# Patient Record
Sex: Female | Born: 1955 | Race: White | Hispanic: No | Marital: Married | Smoking: Current every day smoker
Health system: Southern US, Community
[De-identification: ages and names within clinical notes are randomized; demographics above are authoritative.]

## PROBLEM LIST (undated history)

## (undated) DIAGNOSIS — I219 Acute myocardial infarction, unspecified: Secondary | ICD-10-CM

## (undated) DIAGNOSIS — M1990 Unspecified osteoarthritis, unspecified site: Secondary | ICD-10-CM

## (undated) DIAGNOSIS — I2510 Atherosclerotic heart disease of native coronary artery without angina pectoris: Secondary | ICD-10-CM

## (undated) DIAGNOSIS — E785 Hyperlipidemia, unspecified: Secondary | ICD-10-CM

## (undated) HISTORY — DX: Hyperlipidemia, unspecified: E78.5

## (undated) HISTORY — PX: ABDOMINAL HYSTERECTOMY: SHX81

## (undated) HISTORY — DX: Unspecified osteoarthritis, unspecified site: M19.90

## (undated) HISTORY — PX: ABDOMINAL SURGERY: SHX537

## (undated) HISTORY — PX: CHOLECYSTECTOMY: SHX55

## (undated) HISTORY — DX: Atherosclerotic heart disease of native coronary artery without angina pectoris: I25.10

---

## 2005-11-13 ENCOUNTER — Emergency Department (HOSPITAL_COMMUNITY): Admission: EM | Admit: 2005-11-13 | Discharge: 2005-11-14 | Payer: Self-pay | Admitting: Emergency Medicine

## 2005-11-14 ENCOUNTER — Emergency Department (HOSPITAL_COMMUNITY): Admission: EM | Admit: 2005-11-14 | Discharge: 2005-11-14 | Payer: Self-pay | Admitting: Emergency Medicine

## 2006-07-07 ENCOUNTER — Inpatient Hospital Stay (HOSPITAL_COMMUNITY): Admission: EM | Admit: 2006-07-07 | Discharge: 2006-07-09 | Payer: Self-pay | Admitting: Emergency Medicine

## 2006-07-07 ENCOUNTER — Ambulatory Visit: Payer: Self-pay | Admitting: Cardiology

## 2006-07-07 ENCOUNTER — Encounter: Payer: Self-pay | Admitting: Emergency Medicine

## 2006-07-20 ENCOUNTER — Ambulatory Visit: Payer: Self-pay | Admitting: Cardiology

## 2006-12-17 ENCOUNTER — Ambulatory Visit: Payer: Self-pay | Admitting: Cardiology

## 2006-12-17 LAB — CONVERTED CEMR LAB
AST: 19 units/L (ref 0–37)
Albumin: 4 g/dL (ref 3.5–5.2)
Cholesterol: 176 mg/dL (ref 0–200)
HDL: 53.4 mg/dL (ref >39.0)
Total Bilirubin: 0.6 mg/dL (ref 0.3–1.2)
Total CHOL/HDL Ratio: 3.3
Total Protein: 6 g/dL (ref 6.0–8.3)
Triglycerides: 122 mg/dL (ref 0–149)

## 2007-09-23 ENCOUNTER — Ambulatory Visit: Payer: Self-pay | Admitting: Cardiology

## 2007-09-23 LAB — CONVERTED CEMR LAB
ALT: 16 units/L (ref 0–35)
AST: 17 units/L (ref 0–37)
Alkaline Phosphatase: 47 units/L (ref 39–117)
Cholesterol: 188 mg/dL (ref 0–200)
LDL Cholesterol: 110 mg/dL — ABNORMAL HIGH (ref 0–99)
Total Protein: 6.2 g/dL (ref 6.0–8.3)

## 2008-10-06 ENCOUNTER — Ambulatory Visit: Payer: Self-pay | Admitting: Cardiology

## 2009-06-28 ENCOUNTER — Encounter (INDEPENDENT_AMBULATORY_CARE_PROVIDER_SITE_OTHER): Payer: Self-pay | Admitting: *Deleted

## 2010-01-02 ENCOUNTER — Emergency Department (HOSPITAL_COMMUNITY): Admission: EM | Admit: 2010-01-02 | Discharge: 2010-01-02 | Payer: Self-pay | Admitting: Emergency Medicine

## 2010-02-12 ENCOUNTER — Ambulatory Visit (HOSPITAL_COMMUNITY): Admission: RE | Admit: 2010-02-12 | Discharge: 2010-02-12 | Payer: Self-pay | Admitting: Family Medicine

## 2010-02-25 ENCOUNTER — Ambulatory Visit (HOSPITAL_COMMUNITY): Admission: RE | Admit: 2010-02-25 | Discharge: 2010-02-25 | Payer: Self-pay | Admitting: Family Medicine

## 2010-10-28 LAB — URINE MICROSCOPIC-ADD ON

## 2010-10-28 LAB — URINALYSIS, ROUTINE W REFLEX MICROSCOPIC
Bilirubin Urine: NEGATIVE
Nitrite: NEGATIVE
Specific Gravity, Urine: >1.03 — ABNORMAL HIGH (ref 1.005–1.030)
Urobilinogen, UA: 0.2 mg/dL (ref 0.0–1.0)
pH: 6 (ref 5.0–8.0)

## 2010-10-28 LAB — URINE CULTURE: Colony Count: 80000

## 2010-12-24 NOTE — Assessment & Plan Note (Signed)
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  Since I last saw her, there is no dyspnea, chest pain, palpitations, or  syncope.  There is no pedal edema.  She does continue to smoke.   MEDICATIONS:  1. Aspirin 81 mg p.o. daily.  2. Imdur 30 mg p.o. daily.  3. Fish oil.   PHYSICAL EXAMINATION:  VITAL SIGNS:  Today shows a blood pressure of  110/78 and pulse of 72.  She weighs 157 pounds.  HEENT:  Normal.  NECK:  Supple.  CHEST:  Clear.  CARDIOVASCULAR:  Regular rate and rhythm.  ABDOMEN:  No tenderness.  EXTREMITIES:  No edema.   Her electrocardiogram shows sinus rhythm at a rate of 61.  There are no  ST changes noted.   DIAGNOSES:  1. History of small myocardial infarction secondary to vasospasm in      the circumflex - the patient has had no chest pain or shortness of      breath.  We will continue with her aspirin, but I will discontinue      her Imdur.  I have asked her to resume Zocor 80 mg p.o. daily.  2. History of hyperlipidemia - we will check lipids and liver in 6      weeks after reinitiating Zocor and we will adjust as indicated.      Note, she was unable to afford Crestor and Lipitor in the past.  3. Tobacco abuse - we discussed importance of discontinue this will      between 3-10 minutes.  4. History of osteoarthritis.  5. History vaginal bleeding, on Plavix.   We will see her back in 12 months.  I discussed importance of diet and  exercise.     [name] Jens Som, MD, [facility]  Electronically Signed    BSC/MedQ  DD: 10/06/2008  DT: 10/07/2008  Job #: [id number]   cc:   Mila Homer. Sudie Bailey, M.D.

## 2010-12-24 NOTE — Assessment & Plan Note (Signed)
[facility]                            CARDIOLOGY OFFICE NOTE   NAME:Monica Roman, Monica Roman                      MRN:          [id number]  DATE:12/17/2006                            DOB:          September 02, 1955    Monica Roman is a very pleasant 55 year old female who has a history of  small myocardial infarction secondary to vasospasm of the distal  circumflex. Since I last saw her she has done extremely well. There is  no dyspnea on exertion, orthopnea, PND, pedal edema, palpitations, pre  syncope, syncope, or exertional chest pain.   Her medications include:  1. Aspirin 81 mg p.o. daily.  2. Zocor 80 mg p.o. at bedtime.  3. Imdur 30 mg p.o. daily.   Of note she stopped her Norvasc due to low blood pressure.   Her physical exam today shows a blood pressure of 106/69 and her pulse  is 65. She weighs 149 pounds.  NECK: Supple with no bruits.  CHEST: Clear.  CARDIOVASCULAR EXAM: Reveals a regular rate and rhythm.  ABDOMINAL EXAM: Shows no pulsatile masses. No bruits.  EXTREMITIES: Show no edema.   Her electrocardiogram shows a sinus rhythm at a rate of 69. There are no  ST changes noted.   DIAGNOSES:  1. History of myocardial infarction secondary to vasospasm- we will      continue with the patient's aspirin, Imdur, and statin. Note her      Norvasc was discontinued due to low blood pressure.  2. Tobacco abuse- she has decreased her tobacco use from 2 to 3 packs      per day down to 2 to 3 cigarettes per day. We discussed the      importance of completely discontinuing this in the future.  3. History of vaginal bleeding on Plavix- she did not seen Dr.      Sudie Roman for pelvic exam. I have asked her to follow up with him      concerning this issue and she may need a gynecologic evaluation in      the future. Note she has not had a recent pelvic exam.  4. History of osteoarthritis.   We will see her back in approximately 9 months.    Monica Roman  Monica Som, MD, [facility]  Electronically Signed   BSC/MedQ  DD: 12/17/2006  DT: 12/17/2006  Job #: [id number]   cc:   Monica Roman. Monica Roman, M.D.

## 2010-12-24 NOTE — Assessment & Plan Note (Signed)
 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  Since I last saw her she denies any dyspnea on exertion, orthopnea, PND,  pedal edema, palpitations, presyncope, syncope or chest pain.  She is  smoking approximately two cigarettes per day by her report.  She is  trying to exercise, occasionally walking.  She is also tries to follow a  diet.   MEDICATIONS:  1. Aspirin 81 mg p.o. daily.  2. Zocor 80 p.o. mg daily.  3. Imdur 30 mg p.o. daily.   PHYSICAL EXAM TODAY:  Blood pressure of 115/80 and her pulse is 62.  She  weighs 157 pounds.  HEENT:  Normal.  NECK:  Supple with no bruits.  CHEST:  Clear.  CARDIOVASCULAR:  Regular rate.  ABDOMEN:  No tenderness.  EXTREMITIES:  No edema.   Electrocardiogram shows a sinus rhythm at a rate of 62.  The axis is  normal.  There are no ST changes noted.   DIAGNOSES:  1. History of myocardial infarction secondary to vasospasm.  She has      had no symptoms and we will therefore continue her aspirin, Imdur      and statin.  2. Increased cholesterol.  She will need lipids and liver and we will      adjust as indicated.  3. Tobacco abuse.  We discussed the importance of discontinuing this.  4. History of osteoarthritis.  5. History of vaginal bleeding on Plavix.  She continues to not have      gotten a pelvic exam and  I again encouraged this.   We will see her back in 12 months.     [name] Jens Som, MD, [facility]  Electronically Signed   BSC/MedQ  DD: 09/23/2007  DT: 09/25/2007  Job #: [id number]   cc:   Monica Roman. Monica Roman, M.D.

## 2010-12-27 NOTE — H&P (Signed)
Monica Roman, Monica Roman               ACCOUNT NO.:  [id number]   MEDICAL RECORD NO.:  [id number]          PATIENT TYPE:  INP   LOCATION:  2917                         FACILITY:  [facility]   PHYSICIAN:  [name]. Jens Som, MD, FACCDATE OF BIRTH:  1956-05-13   DATE OF ADMISSION:  07/07/2006  DATE OF DISCHARGE:                              HISTORY & PHYSICAL   PATIENT PROFILE:  A 55 year old Caucasian female with a prior history of  tobacco abuse, who presents with acute inferolateral MI.   PROBLEMS:  1. Acute inferolateral MI.  2. Osteoarthritis.  3. Ongoing tobacco abuse, currently a pack a day with a 30-pack year      history.  4. Status post cholecystectomy several years ago.  5. Status post hysterectomy 1984.  6. History of tonsillectomy and appendectomy at age 22.   HISTORY OF PRESENT ILLNESS:  This 55 year old Caucasian female with  history of tobacco abuse.  For the past several years, since her  cholecystectomy, she has had periodic episodes of chest discomfort with  diaphoresis.  She usually takes a Zantac and those symptoms resolve in  about an hour or so.  This morning, she was at work and she took her  break and ate an egg biscuit and had a cigarette, and subsequently  developed 10/10 substernal chest pressure with shortness of breath and  diaphoresis.  She felt the sweating was worse than usual.  She activated  the EMS and upon their arrival, ECG showed inferolateral ST segmental  elevation with anterior T-wave inversion.  She was taken to [facility]  ED and placed on nitroglycerin infusion.  At that point, code STEMI was  activated and she was transferred to [facility] for further evaluation.  She arrived in the [place] ED at 11:40 a.m. and upon our assessment,  we activated the cath lab and she was taking to the cath lab at 11:50  a.m.  She continues to complain of minimal chest soreness, but feels  symptomatically much better.   ALLERGIES:  NO KNOWN DRUG  ALLERGIES.   HOME MEDICATIONS:  1. She uses p.r.n. Zantac and Motrin.  2. In the ED, she received aspirin 81 mg, 4 tablets x1.  3. She also received heparin 5000 units IV x1.   FAMILY HISTORY:  Her father died of coronary disease.   SOCIAL HISTORY:  She lives in [place] with her husband, son, and  granddaughter.  She works in a Engineer, mining.  She has a 30-pack year  history of tobacco abuse, currently smoking 1 pack a day.  She denies  any alcohol or drugs.  She does not routinely exercise.   REVIEW OF SYSTEMS:  Positive for chest pain, shortness of breath,  diaphoresis.  All other systems reviewed are negative.   PHYSICAL EXAM:  VITAL SIGNS:  She is afebrile.  Heart rate 66,  respirations 20, blood pressure 104/68, pulse ox is 100% on 2 liters.  GENERAL:  A pleasant white female in no acute distress.  Awake, alert,  and oriented x3.  NECK:  Normal carotid upstrokes, no bruits or JVD.  LUNGS:  Respirations regular and nonlabored, clear to auscultation.  CARDIAC:  Regular S1, S2, no S3, S4 or murmurs.  ABDOMEN:  Round, soft, nontender, nondistended.  Bowel sounds present  x4.  EXTREMITIES:  Warm, dry, pink.  No clubbing, cyanosis, or edema.  Dorsalis pedes, posterior tibial pulses 2+ and equal bilaterally.  There  is no evidence of femoral bruits.   Chest x-ray is pending.  EKG shows sinus brady with a rate of 56 and  normal axis.  She has ST-segmental elevation in inferolateral leads with  a T-depression and T-wave inversion anteriorly.  Lab work is pending.   ASSESSMENT AND PLAN:  1. Acute inferolateral myocardial infarction.  The patient has been      taken urgently to the cardiac catheterization lab for diagnostic      catheterization.  We will plan to add aspirin, Plavix (depending      upon results of catheterization) statin and beta blocker.  2. Tobacco abuse/tobacco cessation consult.  We have advised her she      needs to quit.  3. Lipid status currently unknown.   We will check lipids and liver      function tests in the a.m.  4. ? Gastroesophageal reflux disease.  She has a history of chest      discomfort symptoms, resolved with Zantac.  It is not clear if this      may have been angina in the past or if that was truly      gastroesophageal reflux disease and we will add Protonix.      Nicolasa Ducking, ANP      [name]. Jens Som, MD, [facility]  Electronically Signed    CB/MEDQ  D:  07/07/2006  T:  07/07/2006  Job:  [id number]

## 2010-12-27 NOTE — Cardiovascular Report (Signed)
NAMEMARCO, [name]               ACCOUNT NO.:  [id number]   MEDICAL RECORD NO.:  [id number]          PATIENT TYPE:  INP   LOCATION:  2807                         FACILITY:  [facility]   PHYSICIAN:  Veverly Fells. Excell Seltzer, MD  DATE OF BIRTH:  [birth year]   DATE OF PROCEDURE:  07/07/2006  DATE OF DISCHARGE:                            CARDIAC CATHETERIZATION   PROCEDURE:  1. Left heart catheterization.  2. Selective coronary angiography.  3,  Left ventricular angiography.  1. Coronary guidewire insertion into the left circumflex.  2. Intracoronary nitroglycerin and adenosine and Angio-Seal of the      right femoral artery.   INDICATIONS:  [name] is a 55 year old woman who presented initially  to [facility] with an evolving acute inferoposterior ST  elevation myocardial infarction.  She was emergently transferred to  [facility], where she was brought to the cath lab for emergent heart  catheterization in the setting of her ongoing injury current and chest  pain.  She reports a 3-year history of intermittent pains that are  similar in nature, but today's event has been her most persistent and  severe pain.   Procedural details, risks, and indications of the procedure were  explained in detail to the patient.  Informed consent was obtained.  The  right groin was prepped, draped, and anesthetized with 1% lidocaine.  Using a front wall arterial puncture, a 6-French sheath was inserted  using the modified Seldinger technique.  Multiple angiographic views of  the left to right coronary arteries were taken.  For the left coronary  artery, a 6 [place] JL-4 catheter was used; for the right coronary  artery, a 6 [place] JR-4 catheter was used.  Following selective coronary  angiography, there appeared to be a high-grade stenosis in the mid left  circumflex that supplies a left PDA.  I therefore proceeded to start  with an intervention on this vessel.  A 6-French XB 3.5 mm guiding  catheter was inserted.  The patient's ACT was 290.  Integrilin was given  for anticoagulation.  A cougar coronary guidewire was inserted.  Interestingly, the cougar guidewire with a loop in the wire passed  easily beyond the area that appeared stenosed into the PDA branch.  Intracoronary nitroglycerin was given, and angiograms demonstrated that  this region of the vessel was now widely patent.  The wire was pulled  back, and intracoronary adenosine was given as well as an additional  dose of nitroglycerin.  Multiple views of this vessel demonstrated that  the vessel was now angiographically normal.  At that point, the  guidewire and guiding catheter were removed, and an angled pigtail  catheter was inserted into the left ventricle where left ventricular  pressures were recorded.  A 30-degree right anterior oblique left  ventriculogram was performed.  Pullback across the aortic valve was  done.  At the conclusion of the case, a 6-French Angio-Seal device was  used to seal the arteriotomy.   FINDINGS:  Aortic pressure 96/57 with a mean of 79.  LV pressure 96/3  with an end-diastolic pressure of 13.   The  left mainstem is angiographically normal.  It bifurcates into the  LAD and left circumflex.  The LAD is large caliber vessel that courses  down left ventricular apex.  It gives off 3 diagonal branches.  The LAD  and diagonals have no significant angiographic disease.   The left circumflex is a large caliber vessel, and it is dominant.  The  proximal vessel has nonobstructive plaque.  It gives off 3 obtuse  marginal branches that are all angiographically normal.  The mid left  circumflex beyond the third obtuse marginal has an area of apparent 99%  stenosis.  The left PDA fills with TIMI 2 flow.   The right coronary artery is angiographically normal.  It is  nondominant.  It is a medium caliber vessel.  There is no significant  angiographic disease throughout the right coronary  artery.   Left ventriculogram performed in a 30-degree right anterior oblique  projection shows normal left ventricular function with a left jugular  ejection fraction estimated at 60%.  There is no mitral regurgitation.   ASSESSMENT:  1. Acute inferoposterior myocardial infarction secondary to vasospasm      of a dominant left circumflex.  2. Normal left ventricular function.   DISCUSSION:  The patient has clearly demonstrated coronary vasospasm.  Following intracoronary nitroglycerin and adenosine, the vessel returned  to normal.  There was no indication to perform balloon angioplasty or  stenting due to the vasospastic nature of the patient's disease.   RECOMMENDATIONS:  Long-acting nitrate therapy as well as a calcium  channel blocker if her blood pressure tolerates.  Smoking cessation will  be critical to her long-term outcome, and this has been clearly  correlated with coronary vasospasm.      Veverly Fells. Excell Seltzer, MD  Electronically Signed     MDC/MEDQ  D:  07/07/2006  T:  07/07/2006  Job:  [id number]   cc:   Mila Homer. Sudie Bailey, M.D.  [facility], [place]

## 2010-12-27 NOTE — Discharge Summary (Signed)
Monica Roman               ACCOUNT NO.:  [id number]   MEDICAL RECORD NO.:  [id number]          PATIENT TYPE:  INP   LOCATION:  2031                         FACILITY:  [facility]   PHYSICIAN:  Monica Frieze. Jens Som, MD, FACCDATE OF BIRTH:  September 07, 1955   DATE OF ADMISSION:  07/07/2006  DATE OF DISCHARGE:  07/09/2006                               DISCHARGE SUMMARY   PRIMARY CARDIOLOGIST:  Dr. Olga Millers.   PRIMARY CARE PHYSICIAN:  Dr. Mila Homer. Knowl[place].   PRINCIPAL DIAGNOSIS:  Acute inferoposterior ST elevation MI.   SECONDARY DIAGNOSES:  1. Significant coronary vasospasm involving the left circumflex.  2. Osteoarthritis.  3. Ongoing tobacco abuse with 30 pack-year history.  4. History of cholecystectomy several years ago.  5. History of hysterectomy in 1984.  6. History of tonsillectomy and appendectomy at age 37.   ALLERGIES:  No known drug allergies.   PROCEDURES:  Left heart cardiac catheterization.   HISTORY OF PRESENT ILLNESS:  A 55 year old white female with prior  history of tobacco abuse who over several years has had intermittent  episodes of bilateral arm and chest discomfort with radiation to the jaw  typically relieved with time and Zantac therapy.  She was in her usual  state of health on the morning of July 07, 2006 when she was at work  and went outside to smoke a cigarette following her morning snack and  developed acute onset of substernal chest pressure associated with  shortness of breath and diaphoresis.  EMS was activated and ECG showed  inferolateral ST-segment elevation with anterior T-wave inversion.  She  was initially taken to [facility] and placed on IV  nitroglycerin.  At that point, a code STEMI was activated and she was  transferred to [facility] for further evaluation.  On arrival at [facility], she still had mild chest soreness with continued ECG changes and  she was taken emergently to the cardiac cath lab for evaluation.   HOSPITAL COURSE:  Cardiac catheterization performed by Dr. Excell Seltzer  revealed a normal left main, a normal LAD, a 99% stenosis in the distal  left circumflex beyond the third obtuse marginal and a normal  nondominant right coronary artery.  After passing a wire into the distal  left circumflex, there was complete resolution of what was felt to be a  lesion with normal flow.  It was felt that this was secondary to  coronary vasospasm and no further intervention was performed.  She was  initiated on calcium channel blocker therapy and has since not had any  recurrent chest discomfort.  She has been maintained on aspirin, Plavix,  statin therapy and also seen by cardiac rehab.  She will be discharged  home today in satisfactory condition.   DISCHARGE LABS:  Hemoglobin 12.1, hematocrit 35.1, WBC 4.7, platelets  107, MCV 91.4.  Sodium 143, potassium 3.9, chloride 111, CO2 27, BUN 6,  creatinine 0.7, glucose 96, total bilirubin 0.7, alkaline phosphatase  44, AST 50, ALT 19, albumin 3.2.  CK 295, MB 60, total cholesterol 144,  triglycerides 78, HDL 53, LDL 75, calcium  9.0.  TSH 0.657.   DISPOSITION:  The patient is being discharged home today in good  condition.   FOLLOW-UP PLANS/APPOINTMENTS:  She is asked to follow up with primary  care physician, Dr. Mila Homer. Knowlton, in 3-4 weeks or as previously  scheduled.  She has follow-up with Dr. Olga Millers on July 20, 2006 at 11:45 a.m. in our [place] office.   DISCHARGE MEDICATIONS:  1. Aspirin 81 mg daily.  2. Plavix 75 mg daily.  3. Simvastatin 40 mg q.h.s.  4. Norvasc 2.5 mg daily.  5. Nitroglycerin 0.4 mg sublingual p.r.n. chest pain.   She has been counseled on the importance of smoking cessation.   OUTSTANDING LAB STUDIES:  None.  Duration of discharge encounter 40  minutes including physician time.      Monica Roman, ANP      Monica Frieze. Jens Som, MD, [facility]  Electronically Signed    CB/MEDQ  D:  07/09/2006   T:  07/09/2006  Job:  [id number]   cc:   Mila Homer. Sudie Bailey, M.D.

## 2010-12-27 NOTE — Assessment & Plan Note (Signed)
[facility]                            CARDIOLOGY OFFICE NOTE   [name], [name]                      MRN:          [id number]  DATE:07/20/2006                            DOB:          06-Sep-1955    Monica Roman returns for followup today.  She was recently admitted to  [facility] with chest pain and was noted to have inferolateral  ST elevation.  She was taken emergently to cardiac catheterization lab  and was found to have a normal left main, normal LAD, 99% stenosis in  the distal left circumflex beyond the 3rd obtuse marginal, and a normal  nondominant right coronary artery.  After passing a wire to the distal  left circumflex, there was complete resolution of the lesion, and it was  felt this was most likely secondary to vasospasm.  She was discharged on  Norvasc.  Since then, she has had 2 episodes of chest pain, both at  rest.  These are not as severe as previous, and resolved with  nitroglycerin.   MEDICATIONS:  1. Plavix 75 mg p.o. daily.  2. Zocor 40 mg p.o. nightly.  3. Norvasc 2.5 mg p.o. daily.  4. Aspirin 81 mg p.o. daily.   PHYSICAL EXAMINATION:  Shows a blood pressure of 128/84.  Pulse is 70.  NECK:  Supple with no bruits.  CHEST:  Clear.  CARDIOVASCULAR:  Exam is irregular.  ABDOMEN:  Exam shows no pulsatile masses and no bruits.  Her right groin shows no hematoma and no bruit.  EXTREMITIES:  Show no edema.  Electrocardiogram shows sinus rhythm at a rate of 73.  There is  inferolateral T-wave inversion.   DIAGNOSES:  1. Recent myocardial infarction secondary to vasospasm.  2. Tobacco abuse.  3. Recent vaginal bleeding.  4. History of osteoarthritis.   PLAN:  Monica Roman is doing reasonably well.  She has had 2 separate  episodes of chest pain.  Her blood pressure appears to be reasonable  today, and I will add Imdur 30 mg p.o. daily for vasospasm.  She is not  taking a beta blocker for that reason as well.   She will return in  approximately 2 weeks, and  we will check lipids and liver and adjust  her Zocor as indicated with a goal LDL of less than 70.  I again,  discussed the importance of discontinuing her tobacco use.  She has  decreased the amount that she is smoking.  We will discontinue her  Plavix due to her vaginal bleeding.  I have asked her to follow up with  [facility] concerning this issue.  She may  need a GYN evaluation to exclude another cause of her bleeding.  We will  see her back in 3 months.     [name] Jens Som, MD, [facility]  Electronically Signed    BSC/MedQ  DD: 07/20/2006  DT: 07/20/2006  Job #: [id number]   cc:   Mila Homer. Sudie Bailey, M.D.

## 2011-03-16 ENCOUNTER — Emergency Department (HOSPITAL_COMMUNITY): Payer: BC Managed Care – PPO

## 2011-03-16 ENCOUNTER — Emergency Department (HOSPITAL_COMMUNITY)
Admission: EM | Admit: 2011-03-16 | Discharge: 2011-03-16 | Disposition: A | Discharge status: Home / Self Care | Payer: BC Managed Care – PPO | Attending: Emergency Medicine | Admitting: Emergency Medicine

## 2011-03-16 ENCOUNTER — Encounter: Payer: Self-pay | Admitting: Emergency Medicine

## 2011-03-16 DIAGNOSIS — Z87891 Personal history of nicotine dependence: Secondary | ICD-10-CM | POA: Insufficient documentation

## 2011-03-16 DIAGNOSIS — R197 Diarrhea, unspecified: Secondary | ICD-10-CM | POA: Insufficient documentation

## 2011-03-16 DIAGNOSIS — I252 Old myocardial infarction: Secondary | ICD-10-CM | POA: Insufficient documentation

## 2011-03-16 DIAGNOSIS — M129 Arthropathy, unspecified: Secondary | ICD-10-CM

## 2011-03-16 HISTORY — DX: Acute myocardial infarction, unspecified: I21.9

## 2011-03-16 MED ORDER — IBUPROFEN 800 MG PO TABS: 800.0000 mg | ORAL_TABLET | Freq: Three times a day (TID) | ORAL | Status: AC

## 2011-03-16 MED ORDER — KETOROLAC TROMETHAMINE 60 MG/2ML IM SOLN
60.0000 mg | Freq: Once | INTRAMUSCULAR | Status: AC
  Administered 2011-03-16: 60 mg via INTRAMUSCULAR
  Filled 2011-03-16: qty 2

## 2011-03-16 NOTE — ED Provider Notes (Signed)
History     CSN: [id number] Arrival date & time: 03/16/2011  4:06 PM  Chief Complaint  Patient presents with  . Leg Swelling  . Shoulder Pain   HPI Comments: Patient presents with left shoulder pain and bilateral ankle pain. The shoulder pain started approximately 2 weeks ago, has been constant, it is moderate, worse with rotation and abduction. There is no associated swelling, redness, fevers. Last night she noticed that her left ankle started to swell and become tender followed by her right ankle. Today she was at work and had an antalgic gait, was referred to the emergency department for further evaluation prior to returning to work. Patient denies fevers, redness, cough, nausea, vaginal discharge, history of sexually transmitted disease.,  Patient is a 55 y.o. female presenting with shoulder pain. The history is provided by the patient and a relative.  Shoulder Pain Pertinent negatives include no chest pain, no abdominal pain, no headaches and no shortness of breath.    Past Medical History  Diagnosis Date  . MI (myocardial infarction)     Past Surgical History  Procedure Date  . Abdominal hysterectomy   . Cholecystectomy   . Abdominal surgery     Family History  Problem Relation Age of Onset  . Diabetes Mother     History  Substance Use Topics  . Smoking status: Former Games developer  . Smokeless tobacco: Not on file  . Alcohol Use: No    OB History    Grav Para Term Preterm Abortions TAB SAB Ect Mult Living   2 2 2       2       Review of Systems  Constitutional: Negative for fever and chills.  HENT: Negative for sore throat and neck pain.   Eyes: Negative for visual disturbance.  Respiratory: Negative for cough and shortness of breath.   Cardiovascular: Negative for chest pain.  Gastrointestinal: Negative for nausea, vomiting, abdominal pain and diarrhea.  Genitourinary: Negative for dysuria and frequency.  Musculoskeletal: Positive for joint swelling and  arthralgias. Negative for myalgias and back pain.  Skin: Negative for rash.  Neurological: Negative for weakness, numbness and headaches.  Hematological: Negative for adenopathy.  Psychiatric/Behavioral: Negative for behavioral problems.    Physical Exam  BP 123/60  Pulse 69  Temp(Src) 97.6 F (36.4 C) (Oral)  Resp 20  Ht 5\' 8"  (1.727 m)  Wt 145 lb (65.772 kg)  BMI 22.05 kg/m2  SpO2 100%  Physical Exam  Constitutional: She appears well-developed and well-nourished. No distress.  HENT:  Head: Normocephalic and atraumatic.  Mouth/Throat: Oropharynx is clear and moist. No oropharyngeal exudate.  Eyes: Conjunctivae and EOM are normal. Pupils are equal, round, and reactive to light. Right eye exhibits no discharge. Left eye exhibits no discharge. No scleral icterus.  Neck: Normal range of motion. Neck supple. No JVD present. No thyromegaly present.  Cardiovascular: Normal rate, regular rhythm, normal heart sounds and intact distal pulses.  Exam reveals no gallop and no friction rub.   No murmur heard. Pulmonary/Chest: Effort normal and breath sounds normal. No respiratory distress. She has no wheezes. She has no rales.  Abdominal: Soft. Bowel sounds are normal. She exhibits no distension and no mass. There is no tenderness.  Musculoskeletal: Normal range of motion. She exhibits tenderness. She exhibits no edema.       Tenderness to palpation of the left shoulder without redness, swelling, crepitance. Tenderness to the bilateral ankles left greater than right without swelling, redness, warmth, injury. There are normal  pulses at the dorsalis pedis bilaterally, normal capillary refill in all 4 extremities. There is no tenderness to the knees, elbows, hips bilaterally. She has normal grips without any arthritis of the hands.  Lymphadenopathy:    She has no cervical adenopathy.  Neurological: She is alert. Coordination normal.  Skin: Skin is warm and dry. No rash noted. She is not  diaphoretic. No erythema.  Psychiatric: She has a normal mood and affect. Her behavior is normal.    ED Course  Procedures  MDM  patient has no history of arthritis though she has noted to have a slight watery diarrhea nightly for the last 2 weeks. She denies history of this prior to 2 weeks ago. There are no signs of septic joints including redness warmth or fevers. 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 With the ongoing left shoulder pain we'll obtain imaging of the left shoulder and anti-inflammatories for the arthritis pain. Reevaluate the  Imaging reveals osteoarthritis of the a.c. joint on the left but no other sources of fracture or effusion. Will send patient home with anti-inflammatories and followup with her primary care provider.  Dg Shoulder Left  03/16/2011  *RADIOLOGY REPORT*  Clinical Data: Pain and swelling along the shoulder.  LEFT SHOULDER - 2+ VIEW  Comparison: None.  Findings: No acute osseous or joint abnormality.  Degenerative changes are seen in the left acromioclavicular joint.  Visualized portion of the left chest is unremarkable.  IMPRESSION: Left acromioclavicular joint osteoarthritis.  Original Report Authenticated By: Reyes Ivan, M.D.        Vida Roller, MD 03/16/11 [id number]

## 2011-03-16 NOTE — ED Notes (Addendum)
Pt c/o l shoulder pain x 2 weeks-worse with movement, and left ankle swellingx 1 day. Denies cp/sob. Pt also states she has swelling/pain on entire left side. nad noted.

## 2011-03-16 NOTE — ED Notes (Signed)
Pt a/ox4. Resp even and unlabored. NAD at this time. D/C instructions reviewed with pt. Pt verbalized understanding. Pt escorted to d/c desk. Pt ambulated with steady gate. 

## 2011-05-16 ENCOUNTER — Telehealth: Payer: Self-pay | Admitting: Cardiovascular Disease

## 2011-05-16 NOTE — Telephone Encounter (Signed)
Faxed the patient's cath report from 2007 to [phone] Attn: Becky.  Becky's call back number is [phone]..05-16-11 djc

## 2011-05-20 ENCOUNTER — Encounter: Payer: Self-pay | Admitting: Cardiology

## 2011-05-20 ENCOUNTER — Encounter: Payer: Self-pay | Admitting: *Deleted

## 2011-05-20 ENCOUNTER — Ambulatory Visit (HOSPITAL_COMMUNITY)
Admission: RE | Admit: 2011-05-20 | Discharge: 2011-05-20 | Disposition: A | Discharge status: Home / Self Care | Payer: BC Managed Care – PPO | Source: Ambulatory Visit | Attending: Family Medicine | Admitting: Family Medicine

## 2011-05-20 DIAGNOSIS — I2510 Atherosclerotic heart disease of native coronary artery without angina pectoris: Secondary | ICD-10-CM | POA: Insufficient documentation

## 2011-05-20 NOTE — Progress Notes (Signed)
*  PRELIMINARY RESULTS* Echocardiogram 2D Echocardiogram has been performed.  Conrad Milo 05/20/2011, 8:27 AM

## 2011-05-21 ENCOUNTER — Ambulatory Visit (INDEPENDENT_AMBULATORY_CARE_PROVIDER_SITE_OTHER): Payer: BC Managed Care – PPO | Admitting: Cardiology

## 2011-05-21 ENCOUNTER — Encounter: Payer: Self-pay | Admitting: Cardiology

## 2011-05-21 DIAGNOSIS — I2510 Atherosclerotic heart disease of native coronary artery without angina pectoris: Secondary | ICD-10-CM

## 2011-05-21 DIAGNOSIS — E785 Hyperlipidemia, unspecified: Secondary | ICD-10-CM | POA: Insufficient documentation

## 2011-05-21 DIAGNOSIS — R072 Precordial pain: Secondary | ICD-10-CM

## 2011-05-21 DIAGNOSIS — Z720 Tobacco use: Secondary | ICD-10-CM

## 2011-05-21 DIAGNOSIS — R079 Chest pain, unspecified: Secondary | ICD-10-CM | POA: Insufficient documentation

## 2011-05-21 DIAGNOSIS — F17200 Nicotine dependence, unspecified, uncomplicated: Secondary | ICD-10-CM

## 2011-05-21 MED ORDER — SIMVASTATIN 40 MG PO TABS: 40.0000 mg | ORAL_TABLET | Freq: Every evening | ORAL | Status: DC

## 2011-05-21 MED ORDER — ISOSORBIDE MONONITRATE ER 30 MG PO TB24: 30.0000 mg | ORAL_TABLET | Freq: Every day | ORAL | Status: DC

## 2011-05-21 NOTE — Assessment & Plan Note (Signed)
Resume aspirin and statin. 

## 2011-05-21 NOTE — Assessment & Plan Note (Signed)
Resume Zocor 40 mg daily. Check lipids and liver in 6 weeks.

## 2011-05-21 NOTE — Assessment & Plan Note (Signed)
Patient counseled on discontinuing. 

## 2011-05-21 NOTE — Progress Notes (Signed)
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 Cardiac cath in 2007 showed mid left circumflex beyond the third obtuse marginal has an area of apparent 99% stenosis.  The left PDA fills with TIMI 2 flow. However the lesion resolved with intracoronary nitroglycerin. No other CAD and normal LV function. She has not been seen since 2010. Since then, she is complaining of chest pain. This only occurs at work when she feels "stressed". She develops a sharp left-sided chest pain that lasts approximately 20-30 minutes and resolves spontaneously. No associated symptoms. She does not have pain with exertion and there is no dyspnea on exertion, orthopnea, PND or pedal edema. Her pain is not pleuritic or positional.  Current Outpatient Prescriptions  Medication Sig Dispense Refill  . furosemide (LASIX) 40 MG tablet Take 40 mg by mouth 2 (two) times daily.        Marland Kitchen HYDROcodone-acetaminophen (VICODIN) 5-500 MG per tablet Take 1 tablet by mouth daily.        . Potassium (POTASSIMIN PO) Take 1 tablet by mouth daily.           Past Medical History  Diagnosis Date  . MI (myocardial infarction)   . Hyperlipidemia   . Osteoarthritis   . CAD (coronary artery disease)     Past Surgical History  Procedure Date  . Abdominal hysterectomy   . Cholecystectomy   . Abdominal surgery     History   Social History  . Marital Status: Married    Spouse Name: N/A    Number of Children: N/A  . Years of Education: N/A   Occupational History  . Not on file.   Social History Main Topics  . Smoking status: Current Everyday Smoker  . Smokeless tobacco: Not on file  . Alcohol Use: No  . Drug Use: No  . Sexually Active: Not on file   Other Topics Concern  . Not on file   Social History Narrative  . No narrative on file    ROS: no fevers or chills, productive cough, hemoptysis, dysphasia, odynophagia, melena,  hematochezia, dysuria, hematuria, rash, seizure activity, orthopnea, PND, pedal edema, claudication. Remaining systems are negative.  Physical Exam: Well-developed well-nourished in no acute distress.  Skin is warm and dry.  HEENT is normal.  Neck is supple. No thyromegaly.  Chest is clear to auscultation with normal expansion.  Cardiovascular exam is regular rate and rhythm.  Abdominal exam nontender or distended. No masses palpated. Extremities show no edema. neuro grossly intact  ECG sinus bradycardia at a rate of 58. No ST changes.

## 2011-05-21 NOTE — Patient Instructions (Signed)
Your physician recommends that you schedule a follow-up appointment in: 6 WEEKS  Your physician has requested that you have en exercise stress myoview. For further information please visit [email or web address] Please follow instruction sheet, as given.   START ASPIRIN 81 MG ONCE DAILY WITH FOOD  START SIMVASTATIN 40 MG ONCE DAILY AT BEDTIME  Your physician recommends that you return for a FASTING lipid profile: 6 WEEKS  START ISOSORBIDE 30 MG ONCE DAILY

## 2011-05-21 NOTE — Assessment & Plan Note (Signed)
Symptoms somewhat atypical. Electrocardiogram with no ST changes. History of vasospasm. Resume aspirin. Add Imdur 30 mg p.o. Daily. Resume Zocor 40 mg p.o. Daily. Schedule Myoview. Given history of vasospasm I have also again recommended discontinuing tobacco use.

## 2011-05-29 ENCOUNTER — Encounter: Payer: Self-pay | Admitting: *Deleted

## 2011-06-05 ENCOUNTER — Ambulatory Visit (HOSPITAL_COMMUNITY): Payer: BC Managed Care – PPO | Attending: Cardiology | Admitting: Radiology

## 2011-06-05 DIAGNOSIS — R072 Precordial pain: Secondary | ICD-10-CM

## 2011-06-05 DIAGNOSIS — I2510 Atherosclerotic heart disease of native coronary artery without angina pectoris: Secondary | ICD-10-CM | POA: Insufficient documentation

## 2011-06-05 DIAGNOSIS — R0789 Other chest pain: Secondary | ICD-10-CM

## 2011-06-05 DIAGNOSIS — I4949 Other premature depolarization: Secondary | ICD-10-CM

## 2011-06-05 MED ORDER — TECHNETIUM TC 99M TETROFOSMIN IV KIT
33.0000 | PACK | Freq: Once | INTRAVENOUS | Status: AC | PRN
  Administered 2011-06-05: 33 via INTRAVENOUS

## 2011-06-05 MED ORDER — TECHNETIUM TC 99M TETROFOSMIN IV KIT
11.0000 | PACK | Freq: Once | INTRAVENOUS | Status: AC | PRN
  Administered 2011-06-05: 11 via INTRAVENOUS

## 2011-06-05 NOTE — Progress Notes (Signed)
[facility] 3 NUCLEAR MED [address] [place] [address] [phone]  Cardiology Nuclear Med Study  Monica Roman is a 55 y.o. female [id number] [birth year]   Nuclear Med Background Indication for Stress Test:  Evaluation for Ischemia History:  '07 STEMI>Cath:99% CFX, medical tx; 05/20/11 Echo:EF=60-65% Cardiac Risk Factors: Family History - CAD, Lipids and Smoker  Symptoms:  Chest Pressure with Stress.  (last episode of chest discomfort was about one week ago) and Fatigue   Nuclear Pre-Procedure Caffeine/Decaff Intake:  None NPO After: 7:00pm   Lungs:  Clear.  O2 Sat 96% on RA. IV 0.9% NS with Angio Cath:  20g  IV Site: R Antecubital  IV Started by:  Stanton Kidney, EMT-P  Chest Size (in):  36 Cup Size: B  Height: 5\' 9"  (1.753 m)  Weight:  134 lb (60.782 kg)  BMI:  Body mass index is 19.79 kg/(m^2). Tech Comments:  NA    Nuclear Med Study 1 or 2 day study: 1 day  Stress Test Type:  Stress  Reading MD: Charlton Haws, MD  Order Authorizing Provider:  Olga Millers, MD  Resting Radionuclide: Technetium 25m Tetrofosmin  Resting Radionuclide Dose: 11.0 mCi   Stress Radionuclide:  Technetium 57m Tetrofosmin  Stress Radionuclide Dose: 33.0 mCi           Stress Protocol Rest HR: 65 Stress HR: 142  Rest BP: Sitting 94/59  Standing 93/65 Stress BP: 151/67  Exercise Time (min): 8:31 METS: 10.1   Predicted Max HR: 166 bpm % Max HR: 85.54 bpm Rate Pressure Product: 21308   Dose of Adenosine (mg):  n/a Dose of Lexiscan: n/a mg  Dose of Atropine (mg): n/a Dose of Dobutamine: n/a mcg/kg/min (at max HR)  Stress Test Technologist: Smiley Houseman, CMA-N  Nuclear Technologist:  Domenic Polite, CNMT     Rest Procedure:  Myocardial perfusion imaging was performed at rest 45 minutes following the intravenous administration of Technetium 60m Tetrofosmin.  Rest ECG: No acute changes.  Stress Procedure:  The patient exercised for 8:31 on the treadmill  utilizing the Bruce protocol.  The patient stopped due to fatigue and denied any chest pain.  There were no diagnostic ST-T wave changes, rare PVC noted.  Technetium 43m Tetrofosmin was injected at peak exercise and myocardial perfusion imaging was performed after a brief delay.  Stress ECG: No significant change from baseline ECG  QPS Raw Data Images:  Normal; no motion artifact; normal heart/lung ratio. Stress Images:  Normal homogeneous uptake in all areas of the myocardium. Rest Images:  Normal homogeneous uptake in all areas of the myocardium. Subtraction (SDS):  Normal Transient Ischemic Dilatation (Normal <1.22):  1.02 Lung/Heart Ratio (Normal <0.45):  0.31  Quantitative Gated Spect Images QGS EDV:  87 ml QGS ESV:  32 ml QGS cine images:  NL LV Function; NL Wall Motion QGS EF: 63%  Impression Exercise Capacity:  Good exercise capacity. BP Response:  Normal blood pressure response. Clinical Symptoms:  There is dyspnea. ECG Impression:  No significant ST segment change suggestive of ischemia. Comparison with Prior Nuclear Study: No previous nuclear study performed  Overall Impression:  Normal stress nuclear study.     Charlton Haws

## 2011-06-30 ENCOUNTER — Other Ambulatory Visit: Payer: BC Managed Care – PPO | Admitting: *Deleted

## 2011-06-30 ENCOUNTER — Encounter: Payer: BC Managed Care – PPO | Admitting: Cardiology

## 2011-06-30 NOTE — Progress Notes (Signed)
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 Cardiac cath in 2007 showed mid left circumflex beyond the third obtuse marginal has an area of apparent 99% stenosis. The left PDA fills with TIMI 2 flow. However the lesion resolved with intracoronary nitroglycerin. No other CAD and normal LV function. I last saw her in Oct of 2012 and she was complaining of chest pain. Myoview in Oct of 2012 showed an EF of 63 and normal perfusion. Since I last saw her,    Current Outpatient Prescriptions  Medication Sig Dispense Refill  . aspirin EC 81 MG tablet Take 1 tablet (81 mg total) by mouth daily.  150 tablet  2  . furosemide (LASIX) 40 MG tablet Take 40 mg by mouth 2 (two) times daily.        Marland Kitchen HYDROcodone-acetaminophen (VICODIN) 5-500 MG per tablet Take 1 tablet by mouth daily.        . isosorbide mononitrate (IMDUR) 30 MG 24 hr tablet Take 1 tablet (30 mg total) by mouth daily.  30 tablet  11  . Potassium (POTASSIMIN PO) Take 1 tablet by mouth daily.        . simvastatin (ZOCOR) 40 MG tablet Take 1 tablet (40 mg total) by mouth every evening.  30 tablet  11     Past Medical History  Diagnosis Date  . MI (myocardial infarction)   . Hyperlipidemia   . Osteoarthritis   . CAD (coronary artery disease)     Past Surgical History  Procedure Date  . Abdominal hysterectomy   . Cholecystectomy   . Abdominal surgery     History   Social History  . Marital Status: Married    Spouse Name: N/A    Number of Children: N/A  . Years of Education: N/A   Occupational History  . Not on file.   Social History Main Topics  . Smoking status: Current Everyday Smoker  . Smokeless tobacco: Not on file  . Alcohol Use: No  . Drug Use: No  . Sexually Active: Not on file   Other Topics Concern  . Not on file   Social History Narrative  . No narrative on file    ROS: no fevers or chills, productive cough,  hemoptysis, dysphasia, odynophagia, melena, hematochezia, dysuria, hematuria, rash, seizure activity, orthopnea, PND, pedal edema, claudication. Remaining systems are negative.  Physical Exam: Well-developed well-nourished in no acute distress.  Skin is warm and dry.  HEENT is normal.  Neck is supple. No thyromegaly.  Chest is clear to auscultation with normal expansion.  Cardiovascular exam is regular rate and rhythm.  Abdominal exam nontender or distended. No masses palpated. Extremities show no edema. 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 Cardiac cath in 2007 showed mid left circumflex beyond the third obtuse marginal has an area of apparent 99% stenosis.  The left PDA fills with TIMI 2 flow. However the lesion resolved with intracoronary nitroglycerin. No other CAD and normal LV function. She has not been seen since 2010. Since then, she is complaining of chest pain. This only occurs at work when she feels "stressed". She develops a sharp left-sided chest pain that lasts approximately 20-30 minutes and resolves spontaneously. No associated symptoms. She does not have pain with exertion and there is no dyspnea on exertion, orthopnea, PND or pedal edema. Her  pain is not pleuritic or positional.  Current Outpatient Prescriptions  Medication Sig Dispense Refill  . aspirin EC 81 MG tablet Take 1 tablet (81 mg total) by mouth daily.  150 tablet  2  . furosemide (LASIX) 40 MG tablet Take 40 mg by mouth 2 (two) times daily.        Marland Kitchen HYDROcodone-acetaminophen (VICODIN) 5-500 MG per tablet Take 1 tablet by mouth daily.        . isosorbide mononitrate (IMDUR) 30 MG 24 hr tablet Take 1 tablet (30 mg total) by mouth daily.  30 tablet  11  . Potassium (POTASSIMIN PO) Take 1 tablet by mouth daily.        . simvastatin (ZOCOR) 40 MG tablet Take 1 tablet (40 mg total) by mouth  every evening.  30 tablet  11     Past Medical History  Diagnosis Date  . MI (myocardial infarction)   . Hyperlipidemia   . Osteoarthritis   . CAD (coronary artery disease)     Past Surgical History  Procedure Date  . Abdominal hysterectomy   . Cholecystectomy   . Abdominal surgery     History   Social History  . Marital Status: Married    Spouse Name: N/A    Number of Children: N/A  . Years of Education: N/A   Occupational History  . Not on file.   Social History Main Topics  . Smoking status: Current Everyday Smoker  . Smokeless tobacco: Not on file  . Alcohol Use: No  . Drug Use: No  . Sexually Active: Not on file   Other Topics Concern  . Not on file   Social History Narrative  . No narrative on file    ROS: no fevers or chills, productive cough, hemoptysis, dysphasia, odynophagia, melena, hematochezia, dysuria, hematuria, rash, seizure activity, orthopnea, PND, pedal edema, claudication. Remaining systems are negative.  Physical Exam: Well-developed well-nourished in no acute distress.  Skin is warm and dry.  HEENT is normal.  Neck is supple. No thyromegaly.  Chest is clear to auscultation with normal expansion.  Cardiovascular exam is regular rate and rhythm.  Abdominal exam nontender or distended. No masses palpated. Extremities show no edema. neuro grossly intact  ECG sinus bradycardia at a rate of 58. No ST changes.    This encounter was created in error - please disregard.

## 2011-08-06 ENCOUNTER — Encounter: Payer: Self-pay | Admitting: Cardiology

## 2011-10-15 ENCOUNTER — Encounter: Payer: Self-pay | Admitting: Cardiology

## 2014-06-12 ENCOUNTER — Encounter: Payer: Self-pay | Admitting: Cardiology

## 2016-03-30 ENCOUNTER — Encounter (HOSPITAL_COMMUNITY): Payer: Self-pay | Admitting: Emergency Medicine

## 2016-03-30 ENCOUNTER — Emergency Department (HOSPITAL_COMMUNITY)
Admission: EM | Admit: 2016-03-30 | Discharge: 2016-03-30 | Disposition: A | Discharge status: Home / Self Care | Payer: BLUE CROSS/BLUE SHIELD | Attending: Emergency Medicine | Admitting: Emergency Medicine

## 2016-03-30 DIAGNOSIS — Y999 Unspecified external cause status: Secondary | ICD-10-CM | POA: Insufficient documentation

## 2016-03-30 DIAGNOSIS — Z79891 Long term (current) use of opiate analgesic: Secondary | ICD-10-CM | POA: Diagnosis not present

## 2016-03-30 DIAGNOSIS — Z79899 Other long term (current) drug therapy: Secondary | ICD-10-CM | POA: Insufficient documentation

## 2016-03-30 DIAGNOSIS — I2510 Atherosclerotic heart disease of native coronary artery without angina pectoris: Secondary | ICD-10-CM | POA: Diagnosis not present

## 2016-03-30 DIAGNOSIS — S39012A Strain of muscle, fascia and tendon of lower back, initial encounter: Secondary | ICD-10-CM | POA: Diagnosis not present

## 2016-03-30 DIAGNOSIS — Y939 Activity, unspecified: Secondary | ICD-10-CM | POA: Diagnosis not present

## 2016-03-30 DIAGNOSIS — X501XXA Overexertion from prolonged static or awkward postures, initial encounter: Secondary | ICD-10-CM | POA: Diagnosis not present

## 2016-03-30 DIAGNOSIS — Z7982 Long term (current) use of aspirin: Secondary | ICD-10-CM | POA: Diagnosis not present

## 2016-03-30 DIAGNOSIS — Y929 Unspecified place or not applicable: Secondary | ICD-10-CM | POA: Insufficient documentation

## 2016-03-30 DIAGNOSIS — F17200 Nicotine dependence, unspecified, uncomplicated: Secondary | ICD-10-CM | POA: Insufficient documentation

## 2016-03-30 DIAGNOSIS — S3992XA Unspecified injury of lower back, initial encounter: Secondary | ICD-10-CM | POA: Diagnosis present

## 2016-03-30 MED ORDER — CYCLOBENZAPRINE HCL 10 MG PO TABS: 10.0000 mg | ORAL_TABLET | Freq: Three times a day (TID) | ORAL | 0 refills | Status: AC

## 2016-03-30 MED ORDER — DIAZEPAM 5 MG PO TABS
10.0000 mg | ORAL_TABLET | Freq: Once | ORAL | Status: AC
  Administered 2016-03-30: 10 mg via ORAL
  Filled 2016-03-30: qty 2

## 2016-03-30 MED ORDER — DEXAMETHASONE SODIUM PHOSPHATE 4 MG/ML IJ SOLN
8.0000 mg | Freq: Once | INTRAMUSCULAR | Status: AC
  Administered 2016-03-30: 8 mg via INTRAMUSCULAR
  Filled 2016-03-30: qty 2

## 2016-03-30 MED ORDER — DEXAMETHASONE 4 MG PO TABS: 4.0000 mg | ORAL_TABLET | Freq: Two times a day (BID) | ORAL | 0 refills | Status: AC

## 2016-03-30 MED ORDER — ONDANSETRON HCL 4 MG PO TABS
4.0000 mg | ORAL_TABLET | Freq: Once | ORAL | Status: AC
  Administered 2016-03-30: 4 mg via ORAL
  Filled 2016-03-30: qty 1

## 2016-03-30 MED ORDER — DICLOFENAC SODIUM 75 MG PO TBEC: 75.0000 mg | DELAYED_RELEASE_TABLET | Freq: Two times a day (BID) | ORAL | 0 refills | Status: AC

## 2016-03-30 MED ORDER — KETOROLAC TROMETHAMINE 10 MG PO TABS
10.0000 mg | ORAL_TABLET | Freq: Once | ORAL | Status: AC
  Administered 2016-03-30: 10 mg via ORAL
  Filled 2016-03-30: qty 1

## 2016-03-30 NOTE — ED Provider Notes (Signed)
[facility]-EMERGENCY DEPT Provider Note   CSN: [id number] Arrival date & time: 03/30/16  [id number]     History   Chief Complaint Chief Complaint  Patient presents with  . Back Pain    HPI Monica Roman is a 60 y.o. female.  Patient is a 60 year old female who presents to the emergency department with a complaint of lower back pain.  The patient states she's had a problem with lower back problems in the past. 2 days ago she bent over to get a can of spaghetti from a lower cabinet on, and she states her back "locked up". She tried over-the-counter muscle rubs. This helped minimally. She tried Epsom salt soaks and this helped minimally also. This morning she awakened to find that her back was worse than it had been on a previous 2 days and she presents to the emergency department for additional evaluation and management. She denies any loss of bowel or bladder function. She denies any sour area numbness or wrist these areas. She's not had any recurrent falls.    Back Pain      Past Medical History:  Diagnosis Date  . CAD (coronary artery disease)   . Hyperlipidemia   . MI (myocardial infarction) (HCC)   . Osteoarthritis     Patient Active Problem List   Diagnosis Date Noted  . Chest pain 05/21/2011  . Tobacco abuse 05/21/2011  . Hyperlipidemia 05/21/2011  . CAD (coronary artery disease) 05/21/2011    Past Surgical History:  Procedure Laterality Date  . ABDOMINAL HYSTERECTOMY    . ABDOMINAL SURGERY    . CHOLECYSTECTOMY      OB History    Gravida Para Term Preterm AB Living   2 2 2     2    SAB TAB Ectopic Multiple Live Births                   Home Medications    Prior to Admission medications   Medication Sig Start Date End Date Taking? Authorizing Provider  aspirin EC 81 MG tablet Take 1 tablet (81 mg total) by mouth daily. 05/21/11  Yes Lewayne BuntingBrian S Crenshaw, MD  HYDROcodone-acetaminophen (NORCO) 7.5-325 MG tablet Take 1 tablet by mouth every 6 (six) hours as needed  for moderate pain.   Yes Historical Provider, MD  HYDROcodone-acetaminophen (VICODIN) 5-500 MG per tablet Take 1 tablet by mouth daily.     Yes Historical Provider, MD  LORazepam (ATIVAN) 1 MG tablet Take 1 mg by mouth at bedtime as needed for sleep.   Yes Historical Provider, MD  cyclobenzaprine (FLEXERIL) 10 MG tablet Take 1 tablet (10 mg total) by mouth 3 (three) times daily. 03/30/16   Ivery QualeHobson Nakira Litzau, PA-C  dexamethasone (DECADRON) 4 MG tablet Take 1 tablet (4 mg total) by mouth 2 (two) times daily with a meal. 03/30/16   Ivery QualeHobson Beldon Nowling, PA-C  diclofenac (VOLTAREN) 75 MG EC tablet Take 1 tablet (75 mg total) by mouth 2 (two) times daily. 03/30/16   Ivery QualeHobson Song Garris, PA-C    Family History Family History  Problem Relation Age of Onset  . Diabetes Mother     Social History Social History  Substance Use Topics  . Smoking status: Current Every Day Smoker    Packs/day: 0.50  . Smokeless tobacco: Never Used  . Alcohol use No     Allergies   Review of patient's allergies indicates no known allergies.   Review of Systems Review of Systems  Musculoskeletal: Positive for arthralgias and back pain.  All other systems reviewed and are negative.    Physical Exam Updated Vital Signs BP 146/78   Pulse 81   Temp 97.9 F (36.6 C) (Oral)   Resp 18   Ht 5\' 6"  (1.676 m)   Wt 64 kg   SpO2 98%   BMI 22.76 kg/m   Physical Exam  Constitutional: She is oriented to person, place, and time. She appears well-developed and well-nourished.  Non-toxic appearance.  HENT:  Head: Normocephalic.  Right Ear: Tympanic membrane and external ear normal.  Left Ear: Tympanic membrane and external ear normal.  Eyes: EOM and lids are normal. Pupils are equal, round, and reactive to light.  Neck: Normal range of motion. Neck supple. Carotid bruit is not present.  Cardiovascular: Normal rate, regular rhythm, normal heart sounds, intact distal pulses and normal pulses.   Pulmonary/Chest: Breath sounds normal.  No respiratory distress.  Abdominal: Soft. Bowel sounds are normal. There is no tenderness. There is no guarding.  Musculoskeletal:       Lumbar back: She exhibits decreased range of motion, pain and spasm.  Lymphadenopathy:       Head (right side): No submandibular adenopathy present.       Head (left side): No submandibular adenopathy present.    She has no cervical adenopathy.  Neurological: She is alert and oriented to person, place, and time. She has normal strength. No cranial nerve deficit or sensory deficit.  Skin: Skin is warm and dry.  Psychiatric: She has a normal mood and affect. Her speech is normal.  Nursing note and vitals reviewed.    ED Treatments / Results  Labs (all labs ordered are listed, but only abnormal results are displayed) Labs Reviewed - No data to display  EKG  EKG Interpretation None       Radiology No results found.  Procedures Procedures (including critical care time)  Medications Ordered in ED Medications  dexamethasone (DECADRON) injection 8 mg (8 mg Intramuscular Given 03/30/16 1021)  ketorolac (TORADOL) tablet 10 mg (10 mg Oral Given 03/30/16 1019)  diazepam (VALIUM) tablet 10 mg (10 mg Oral Given 03/30/16 1020)  ondansetron (ZOFRAN) tablet 4 mg (4 mg Oral Given 03/30/16 1019)     Initial Impression / Assessment and Plan / ED Course  I have reviewed the triage vital signs and the nursing notes.  Pertinent labs & imaging results that were available during my care of the patient were reviewed by me and considered in my medical decision making (see chart for details).  Clinical Course    *I have reviewed nursing notes, vital signs, and all appropriate lab and imaging results for this patient.**  Final Clinical Impressions(s) / ED Diagnoses  Vital signs within normal limits. The patient was treated in the emergency department with intramuscular Decadron. Treated with oral Valium, and Toradol. Patient ate knowledge is some improvement  in the pain. The patient will be given a prescription for Flexeril, Decadron, diclofenac. Patient is to follow-up with her primary physician for additional evaluation and management. Patient is in agreement with this plan.    Final diagnoses:  Lumbar strain, initial encounter    New Prescriptions New Prescriptions   CYCLOBENZAPRINE (FLEXERIL) 10 MG TABLET    Take 1 tablet (10 mg total) by mouth 3 (three) times daily.   DEXAMETHASONE (DECADRON) 4 MG TABLET    Take 1 tablet (4 mg total) by mouth 2 (two) times daily with a meal.   DICLOFENAC (VOLTAREN) 75 MG EC TABLET    Take  1 tablet (75 mg total) by mouth 2 (two) times daily.     Ivery QualeHobson Cortana Vanderford, PA-C 03/30/16 1104    Eber HongBrian Miller, MD 03/30/16 1630

## 2016-03-30 NOTE — Discharge Instructions (Signed)
Heating pad to your back maybe helpful. Please use Decadron and diclofenac 2 times daily. Use Flexeril 3 times daily for spasm pain. This medication may cause drowsiness, please use with caution. Please see Dr. Sudie BaileyKnowlton next week for follow-up and recheck of your back as well as any additional management.

## 2016-03-30 NOTE — ED Triage Notes (Signed)
Pt having lower back pain since Friday after bending down to get a can out of cabinet.  Pt denies urinary symptoms.

## 2016-03-30 NOTE — ED Notes (Signed)
Patient with no complaints at this time. Respirations even and unlabored. Skin warm/dry. Discharge instructions reviewed with patient at this time. Patient given opportunity to voice concerns/ask questions. Patient discharged at this time and left Emergency Department with steady gait.   

## 2017-06-11 ENCOUNTER — Emergency Department (HOSPITAL_COMMUNITY)
Admission: EM | Admit: 2017-06-11 | Discharge: 2017-06-11 | Disposition: A | Discharge status: Home / Self Care | Payer: BLUE CROSS/BLUE SHIELD | Attending: Emergency Medicine | Admitting: Emergency Medicine

## 2017-06-11 ENCOUNTER — Encounter (HOSPITAL_COMMUNITY): Payer: Self-pay | Admitting: Emergency Medicine

## 2017-06-11 DIAGNOSIS — F17210 Nicotine dependence, cigarettes, uncomplicated: Secondary | ICD-10-CM | POA: Insufficient documentation

## 2017-06-11 DIAGNOSIS — Z7982 Long term (current) use of aspirin: Secondary | ICD-10-CM | POA: Diagnosis not present

## 2017-06-11 DIAGNOSIS — I2510 Atherosclerotic heart disease of native coronary artery without angina pectoris: Secondary | ICD-10-CM | POA: Insufficient documentation

## 2017-06-11 DIAGNOSIS — H9202 Otalgia, left ear: Secondary | ICD-10-CM | POA: Diagnosis present

## 2017-06-11 DIAGNOSIS — Z79899 Other long term (current) drug therapy: Secondary | ICD-10-CM | POA: Insufficient documentation

## 2017-06-11 DIAGNOSIS — H6692 Otitis media, unspecified, left ear: Secondary | ICD-10-CM | POA: Diagnosis not present

## 2017-06-11 DIAGNOSIS — H6690 Otitis media, unspecified, unspecified ear: Secondary | ICD-10-CM

## 2017-06-11 MED ORDER — AMOXICILLIN 500 MG PO CAPS: 500.0000 mg | ORAL_CAPSULE | Freq: Two times a day (BID) | ORAL | 0 refills | Status: AC

## 2017-06-11 NOTE — ED Provider Notes (Signed)
[facility] EMERGENCY DEPARTMENT Provider Note   CSN: [id number] Arrival date & time: 06/11/17  1239     History   Chief Complaint Chief Complaint  Patient presents with  . Otalgia  . Sore Throat    HPI Monica Roman is a 61 y.o. female.  HPI   Monica Roman is a 61 year old female with a history of tobacco use, hyperlipidemia, CAD, MI, tonsillectomy who presents the emergency department for evaluation of sore throat and left ear pain.  Patient states that she developed 10/10 constant "shooting"left ear pain approximately 4 days ago.  It is worsened with touching the ear in any way, lying on the left side.  States that she has tried putting a Regulatory affairs officer in the ear soaked in Vicks. She also endorses mild muffled hearing loss from that ear.  She also states that she has had 2 weeks of congestion, sore throat and hoarseness.  States that her sore throat is worsened with swallowing.  Improved with warm tea mixed with honey.  She denies fever, trismus, dysphagia, cough, shortness of breath, chest pain.  No known sick contacts.  Denies antibiotic allergy or use in the past month.  Past Medical History:  Diagnosis Date  . CAD (coronary artery disease)   . Hyperlipidemia   . MI (myocardial infarction) (HCC)   . Osteoarthritis     Patient Active Problem List   Diagnosis Date Noted  . Chest pain 05/21/2011  . Tobacco abuse 05/21/2011  . Hyperlipidemia 05/21/2011  . CAD (coronary artery disease) 05/21/2011    Past Surgical History:  Procedure Laterality Date  . ABDOMINAL HYSTERECTOMY    . ABDOMINAL SURGERY    . CHOLECYSTECTOMY      OB History    Gravida Para Term Preterm AB Living   2 2 2     2    SAB TAB Ectopic Multiple Live Births                   Home Medications    Prior to Admission medications   Medication Sig Start Date End Date Taking? Authorizing Provider  amoxicillin (AMOXIL) 500 MG capsule Take 1 capsule (500 mg total) by mouth 2 (two) times daily.  06/11/17   Kellie Shropshire, PA-C  aspirin EC 81 MG tablet Take 1 tablet (81 mg total) by mouth daily. 05/21/11   Lewayne Bunting, MD  cyclobenzaprine (FLEXERIL) 10 MG tablet Take 1 tablet (10 mg total) by mouth 3 (three) times daily. 03/30/16   Ivery Quale, PA-C  dexamethasone (DECADRON) 4 MG tablet Take 1 tablet (4 mg total) by mouth 2 (two) times daily with a meal. 03/30/16   Ivery Quale, PA-C  diclofenac (VOLTAREN) 75 MG EC tablet Take 1 tablet (75 mg total) by mouth 2 (two) times daily. 03/30/16   Ivery Quale, PA-C  HYDROcodone-acetaminophen (NORCO) 7.5-325 MG tablet Take 1 tablet by mouth every 6 (six) hours as needed for moderate pain.    [provider]  HYDROcodone-acetaminophen (VICODIN) 5-500 MG per tablet Take 1 tablet by mouth daily.      [provider]  LORazepam (ATIVAN) 1 MG tablet Take 1 mg by mouth at bedtime as needed for sleep.    [provider]    Family History Family History  Problem Relation Age of Onset  . Diabetes Mother     Social History Social History  Substance Use Topics  . Smoking status: Current Every Day Smoker    Packs/day: 0.50  .  Smokeless tobacco: Never Used  . Alcohol use No     Allergies   Patient has no known allergies.   Review of Systems Review of Systems  Constitutional: Negative for chills, fatigue, fever and unexpected weight change.  HENT: Positive for congestion, ear pain, rhinorrhea and sore throat. Negative for ear discharge, facial swelling, sinus pain, sinus pressure, sneezing and trouble swallowing.   Eyes: Negative for visual disturbance.  Respiratory: Negative for cough and shortness of breath.   Cardiovascular: Negative for chest pain.     Physical Exam Updated Vital Signs BP 114/60 (BP Location: Right Arm)   Pulse (!) 52   Temp 98.6 F (37 C) (Oral)   Resp 18   Ht 5\' 7"  (1.702 m)   Wt 63.5 kg (140 lb)   SpO2 94%   BMI 21.93 kg/m   Physical Exam  Constitutional: She  appears well-developed and well-nourished. No distress.  HENT:  Head: Normocephalic and atraumatic.  Clear mucous membranes.  Oropharynx clear without erythema or exudate.  Uvula midline.  No trismus.  Right TM with good cone of light.  Left tragus and auricle tender.  No mastoid tenderness or erythema.  Left ear canal normal. Left TM bulging with opacity noted behind.  Nasal cavity with clear rhinorrhea.   Eyes: Pupils are equal, round, and reactive to light. Conjunctivae are normal. Right eye exhibits no discharge. Left eye exhibits no discharge.  Neck: Normal range of motion. Neck supple. No tracheal deviation present.  Anterior chain tender cervical adenopathy.  Cardiovascular: Normal rate, regular rhythm and intact distal pulses.  Exam reveals no friction rub.   No murmur heard. Pulmonary/Chest: Effort normal and breath sounds normal. No respiratory distress. She has no wheezes. She has no rales.  Neurological: She is alert. Coordination normal.  Skin: She is not diaphoretic.  Psychiatric: She has a normal mood and affect. Her behavior is normal.  Nursing note and vitals reviewed.    ED Treatments / Results  Labs (all labs ordered are listed, but only abnormal results are displayed) Labs Reviewed - No data to display  EKG  EKG Interpretation None       Radiology No results found.  Procedures Procedures (including critical care time)  Medications Ordered in ED Medications - No data to display   Initial Impression / Assessment and Plan / ED Course  I have reviewed the triage vital signs and the nursing notes.  Pertinent labs & imaging results that were available during my care of the patient were reviewed by me and considered in my medical decision making (see chart for details).     Patient presents with otalgia and exam consistent with acute otitis media. No concern for acute mastoiditis, meningitis.  No antibiotic use in the last month.  Patient discharged home  with Amoxicillin and Tylenol for pain.  Given patient also has hoarseness and a history of smoking, have discussed follow-up with her primary doctor if hoarseness does not resolve as she is at risk for laryngeal cancer. Discussed return precautions and patient agrees and voices understanding with the plan.  Parent expresses understanding and agrees with plan.   Final Clinical Impressions(s) / ED Diagnoses   Final diagnoses:  Acute otitis media, unspecified otitis media type    New Prescriptions New Prescriptions   AMOXICILLIN (AMOXIL) 500 MG CAPSULE    Take 1 capsule (500 mg total) by mouth 2 (two) times daily.     Kellie Shropshire, PA-C 06/11/17 1511    Jacubowitz,  Sam, MD 06/11/17 [id number]

## 2017-06-11 NOTE — Discharge Instructions (Signed)
Your exam shows ear infection.  I have written you a prescription for an antibiotic called amoxicillin.  Please take this twice a day for the next 7 days.  Please take all of your antibiotics until finished!   You may develop abdominal discomfort or diarrhea from the antibiotic.  You may help offset this with probiotics which you can buy or get in yogurt. Do not eat  or take the probiotics until 2 hours after your antibiotic.   Please schedule an appointment with your primary doctor if your hoarseness does not improve. You are at risk for laryngeal cancer given your smoking history.   Please return to the emergency department for any new or worsening symptoms.

## 2017-06-11 NOTE — ED Triage Notes (Signed)
PT c/o left sided ear pain and sore throat x1 week.

## 2020-10-24 ENCOUNTER — Other Ambulatory Visit (HOSPITAL_COMMUNITY): Payer: Self-pay | Admitting: Nurse Practitioner

## 2020-10-24 DIAGNOSIS — R42 Dizziness and giddiness: Secondary | ICD-10-CM

## 2020-10-25 ENCOUNTER — Ambulatory Visit (HOSPITAL_COMMUNITY)
Admission: RE | Admit: 2020-10-25 | Discharge: 2020-10-25 | Disposition: A | Discharge status: Home / Self Care | Payer: BC Managed Care – PPO | Source: Ambulatory Visit | Attending: Nurse Practitioner | Admitting: Nurse Practitioner

## 2020-10-25 ENCOUNTER — Other Ambulatory Visit: Payer: Self-pay

## 2020-10-25 DIAGNOSIS — R42 Dizziness and giddiness: Secondary | ICD-10-CM | POA: Diagnosis present

## 2021-03-14 ENCOUNTER — Other Ambulatory Visit: Payer: Self-pay

## 2021-03-14 ENCOUNTER — Emergency Department (HOSPITAL_COMMUNITY)
Admission: EM | Admit: 2021-03-14 | Discharge: 2021-03-15 | Disposition: A | Discharge status: Home / Self Care | Payer: BC Managed Care – PPO | Attending: Emergency Medicine | Admitting: Emergency Medicine

## 2021-03-14 ENCOUNTER — Encounter (HOSPITAL_COMMUNITY): Payer: Self-pay

## 2021-03-14 DIAGNOSIS — F17210 Nicotine dependence, cigarettes, uncomplicated: Secondary | ICD-10-CM | POA: Diagnosis not present

## 2021-03-14 DIAGNOSIS — S01511A Laceration without foreign body of lip, initial encounter: Secondary | ICD-10-CM | POA: Insufficient documentation

## 2021-03-14 DIAGNOSIS — Y92002 Bathroom of unspecified non-institutional (private) residence single-family (private) house as the place of occurrence of the external cause: Secondary | ICD-10-CM | POA: Insufficient documentation

## 2021-03-14 DIAGNOSIS — W01198A Fall on same level from slipping, tripping and stumbling with subsequent striking against other object, initial encounter: Secondary | ICD-10-CM | POA: Insufficient documentation

## 2021-03-14 DIAGNOSIS — Z23 Encounter for immunization: Secondary | ICD-10-CM | POA: Insufficient documentation

## 2021-03-14 DIAGNOSIS — I2510 Atherosclerotic heart disease of native coronary artery without angina pectoris: Secondary | ICD-10-CM | POA: Diagnosis not present

## 2021-03-14 DIAGNOSIS — S0993XA Unspecified injury of face, initial encounter: Secondary | ICD-10-CM | POA: Diagnosis present

## 2021-03-14 MED ORDER — LIDOCAINE HCL (PF) 1 % IJ SOLN
10.0000 mL | Freq: Once | INTRAMUSCULAR | Status: AC
  Administered 2021-03-14: 10 mL
  Filled 2021-03-14: qty 30

## 2021-03-14 MED ORDER — TETANUS-DIPHTH-ACELL PERTUSSIS 5-2.5-18.5 LF-MCG/0.5 IM SUSY
0.5000 mL | PREFILLED_SYRINGE | Freq: Once | INTRAMUSCULAR | Status: AC
  Administered 2021-03-14: 0.5 mL via INTRAMUSCULAR
  Filled 2021-03-14: qty 0.5

## 2021-03-14 NOTE — ED Provider Notes (Signed)
[facility]-EMERGENCY DEPT [facility] Emergency Department Provider Note MRN:  [id number]  Arrival date & time: 03/15/21     Chief Complaint   Fall (Laceration to lip)   History of Present Illness   Monica Roman is a 65 y.o. year-old female with a history of CAD presenting to the ED with chief complaint of lip laceration.  Patient tripped while stepping over the dog gate falling forward.  She was able to brace her fall and denies any significant head trauma.  No loss of consciousness.  She thinks that she grazed her lower lip on the open door and is here for lip laceration.  She has mild to moderate tenderness to the lip with any motion or palpation.  She denies any headache, no nausea vomiting, no neck or back pain, no chest pain or shortness of breath, no abdominal pain, no other injuries.  Review of Systems  A complete 10 system review of systems was obtained and all systems are negative except as noted in the HPI and PMH.   Patient's Health History    Past Medical History:  Diagnosis Date   CAD (coronary artery disease)    Hyperlipidemia    MI (myocardial infarction) (HCC)    Osteoarthritis     Past Surgical History:  Procedure Laterality Date   ABDOMINAL HYSTERECTOMY     ABDOMINAL SURGERY     CHOLECYSTECTOMY      Family History  Problem Relation Age of Onset   Diabetes Mother     Social History   Socioeconomic History   Marital status: Married    Spouse name: Not on file   Number of children: Not on file   Years of education: Not on file   Highest education level: Not on file  Occupational History   Not on file  Tobacco Use   Smoking status: Every Day    Packs/day: 0.50    Types: Cigarettes   Smokeless tobacco: Never  Vaping Use   Vaping Use: Never used  Substance and Sexual Activity   Alcohol use: No   Drug use: No   Sexual activity: Not on file  Other Topics Concern   Not on file  Social History Narrative   Not on file   Social Determinants of  Health   Financial Resource Strain: Not on file  Food Insecurity: Not on file  Transportation Needs: Not on file  Physical Activity: Not on file  Stress: Not on file  Social Connections: Not on file  Intimate Partner Violence: Not on file     Physical Exam   Vitals:   03/14/21 2200  BP: 131/63  Pulse: 71  Resp: 18  Temp: 97.9 F (36.6 C)  SpO2: 100%    CONSTITUTIONAL: Well-appearing, NAD NEURO:  Alert and oriented x 3, no focal deficits EYES:  eyes equal and reactive ENT/NECK:  no LAD, no JVD; linear laceration of the right lower lip CARDIO: Regular rate, well-perfused, normal S1 and S2 PULM:  CTAB no wheezing or rhonchi GI/GU:  normal bowel sounds, non-distended, non-tender MSK/SPINE:  No gross deformities, no edema SKIN:  no rash, atraumatic PSYCH:  Appropriate speech and behavior  *Additional and/or pertinent findings included in MDM below  Diagnostic and Interventional Summary    EKG Interpretation  Date/Time:    Ventricular Rate:    PR Interval:    QRS Duration:   QT Interval:    QTC Calculation:   R Axis:     Text Interpretation:  Labs Reviewed - No data to display  No orders to display    Medications  Tdap (BOOSTRIX) injection 0.5 mL (0.5 mLs Intramuscular Given 03/14/21 2338)  lidocaine (PF) (XYLOCAINE) 1 % injection 10 mL (10 mLs Infiltration Given 03/14/21 2338)     Procedures  /  Critical Care .Marland KitchenLaceration Repair  Date/Time: 03/15/2021 12:34 AM Performed by: Sabas Sous, MD Authorized by: Sabas Sous, MD   Consent:    Consent obtained:  Verbal   Consent given by:  Patient   Risks, benefits, and alternatives were discussed: yes     Risks discussed:  Infection, need for additional repair, nerve damage, poor wound healing, poor cosmetic result, pain, retained foreign body, tendon damage and vascular damage Universal protocol:    Procedure explained and questions answered to patient or proxy's satisfaction: yes     Immediately  prior to procedure, a time out was called: yes     Patient identity confirmed:  Verbally with patient Anesthesia:    Anesthesia method:  Local infiltration   Local anesthetic:  Lidocaine 1% w/o epi Laceration details:    Location:  Lip   Lip location:  Lower interior lip   Length (cm):  5   Depth (mm):  3 Pre-procedure details:    Preparation:  Patient was prepped and draped in usual sterile fashion Exploration:    Limited defect created (wound extended): no     Hemostasis achieved with:  Direct pressure   Wound exploration: wound explored through full range of motion and entire depth of wound visualized     Contaminated: no   Treatment:    Area cleansed with:  Saline   Amount of cleaning:  Standard   Debridement:  Minimal   Undermining:  None Skin repair:    Repair method:  Sutures   Suture size:  5-0   Suture material:  Fast-absorbing gut   Suture technique:  Simple interrupted   Number of sutures:  8 Approximation:    Approximation:  Close   Vermilion border well-aligned: yes   Repair type:    Repair type:  Simple Post-procedure details:    Dressing:  Open (no dressing)   Procedure completion:  Tolerated well, no immediate complications Comments:     Long linear lip laceration along with the wet dry border  ED Course and Medical Decision Making  I have reviewed the triage vital signs, the nursing notes, and pertinent available records from the EMR.  Listed above are laboratory and imaging tests that I personally ordered, reviewed, and interpreted and then considered in my medical decision making (see below for details).  Isolated lip laceration, no significant head trauma, no LOC, no nausea vomiting, not anticoagulated, no indication for CNS imaging.  Will need laceration repair, will update tetanus.     Repaired as described above, appropriate for discharge.  Elmer Sow. Pilar Plate, MD [facility]  Health [email or web address]  Final Clinical Impressions(s) / ED Diagnoses     ICD-10-CM   1. Lip laceration, initial encounter  T55.732K       ED Discharge Orders     None        Discharge Instructions Discussed with and Provided to Patient:     Discharge Instructions      You were evaluated in the Emergency Department and after careful evaluation, we did not find any emergent condition requiring admission or further testing in the hospital.  Your exam/testing today was overall reassuring.  We repaired  your lip laceration here in the emergency department.  The stitches are absorbable and do not need to be removed.  Please return to the Emergency Department if you experience any worsening of your condition.  Thank you for allowing [place] to be a part of your care.         Sabas Sous, MD 03/15/21 [phone]

## 2021-03-14 NOTE — ED Notes (Signed)
Suture cart at bedside at this time.  

## 2021-03-14 NOTE — ED Triage Notes (Signed)
Pt presents to ED from home for fall. Pt says she tripped over dog gait and hit lip on bathroom door. Laceration to right side of bottom lip-bleeding controlled. Pt denies LOC

## 2021-03-15 NOTE — Discharge Instructions (Addendum)
You were evaluated in the Emergency Department and after careful evaluation, we did not find any emergent condition requiring admission or further testing in the hospital.  Your exam/testing today was overall reassuring.  We repaired your lip laceration here in the emergency department.  The stitches are absorbable and do not need to be removed.  Please return to the Emergency Department if you experience any worsening of your condition.  Thank you for allowing [place] to be a part of your care.

## 2021-11-22 ENCOUNTER — Other Ambulatory Visit (HOSPITAL_COMMUNITY): Payer: Self-pay | Admitting: Family Medicine

## 2021-11-22 DIAGNOSIS — Z780 Asymptomatic menopausal state: Secondary | ICD-10-CM

## 2021-11-22 DIAGNOSIS — Z1231 Encounter for screening mammogram for malignant neoplasm of breast: Secondary | ICD-10-CM

## 2021-12-01 IMAGING — US US CAROTID DUPLEX BILAT
1 series · 13 of 24 positions shown · non-contrast
Comparison: None.

CLINICAL DATA: 64-year-old female with dizziness

EXAM:
BILATERAL CAROTID DUPLEX ULTRASOUND
TECHNIQUE: Gray scale imaging, color Doppler and duplex ultrasound were
performed of bilateral carotid and vertebral arteries in the neck.

[Series 1: us carotid bilateral · 13 of 68 slices shown]
[im 1/68]
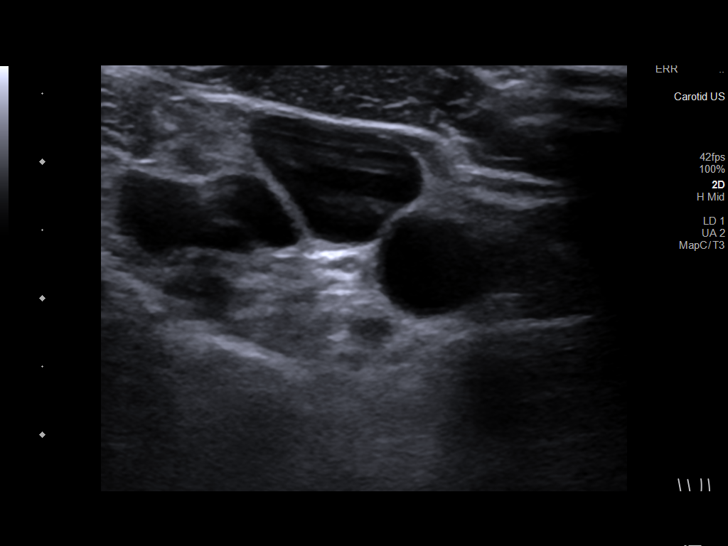
[im 6/68]
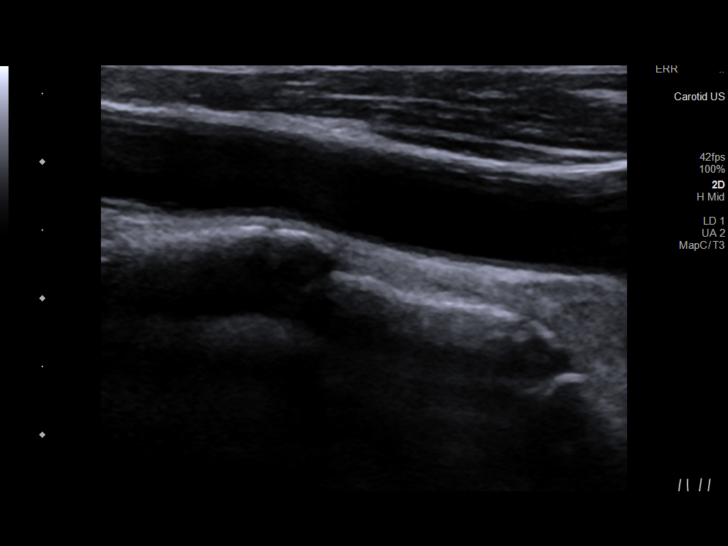
[im 12/68]
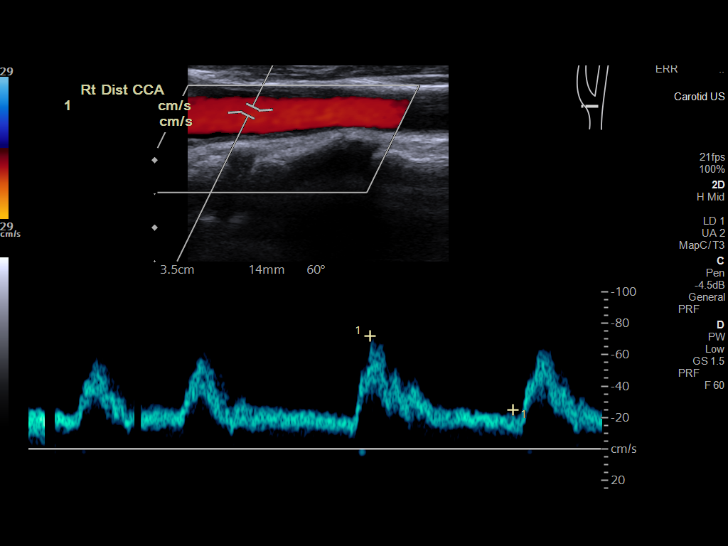
[im 18/68]
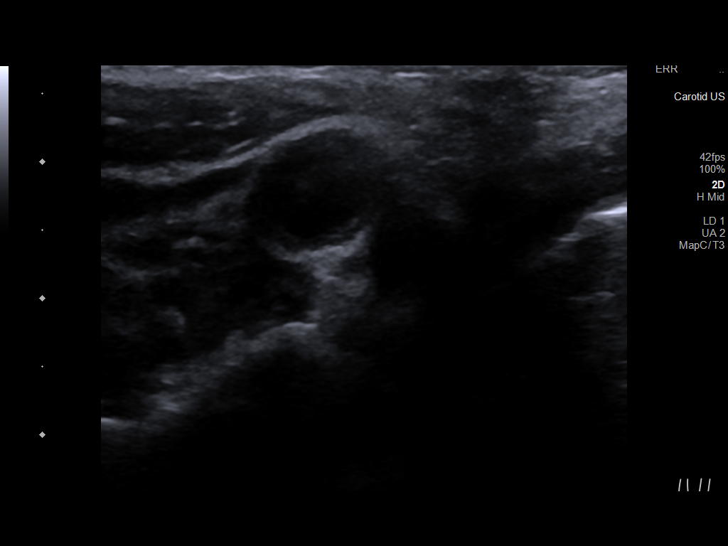
[im 24/68]
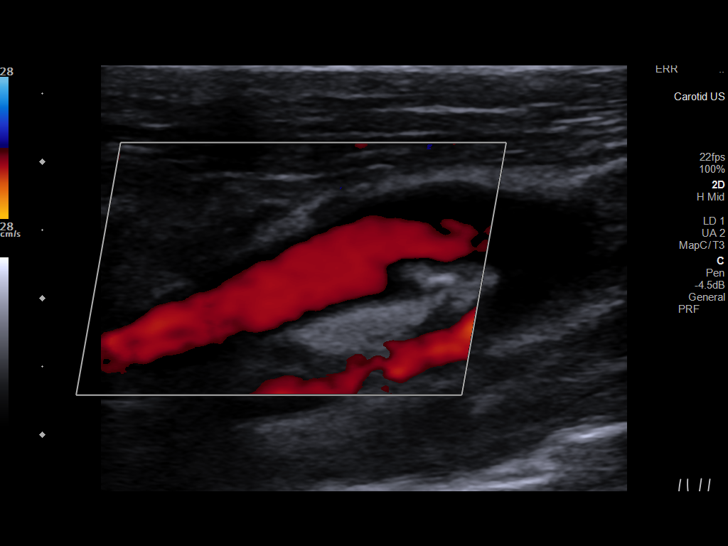
[im 30/68]
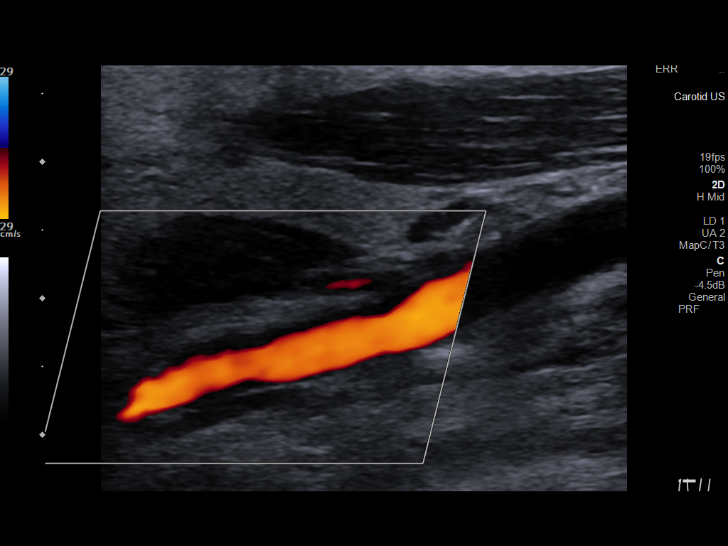
[im 35/68]
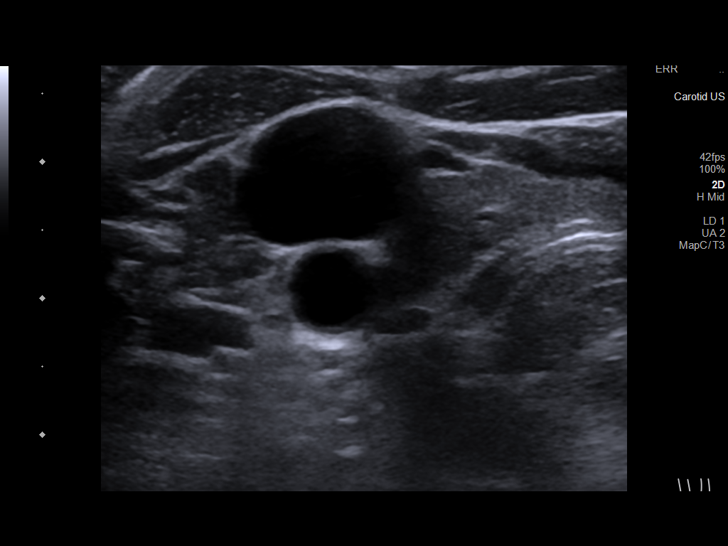
[im 38/68]
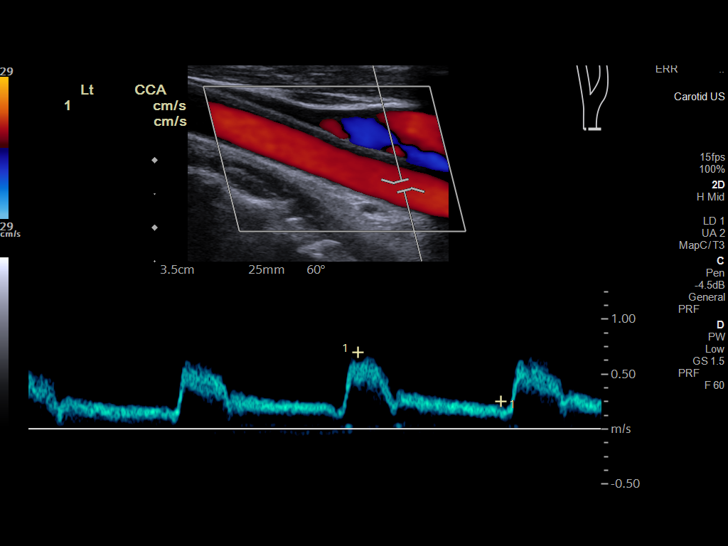
[im 44/68]
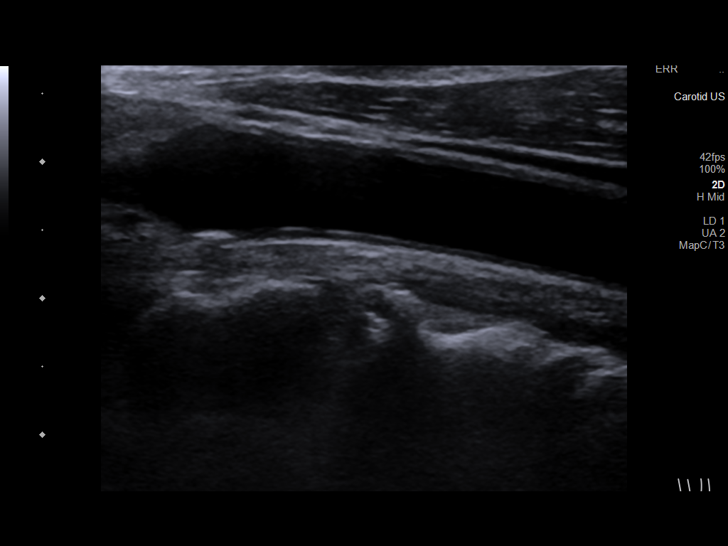
[im 50/68]
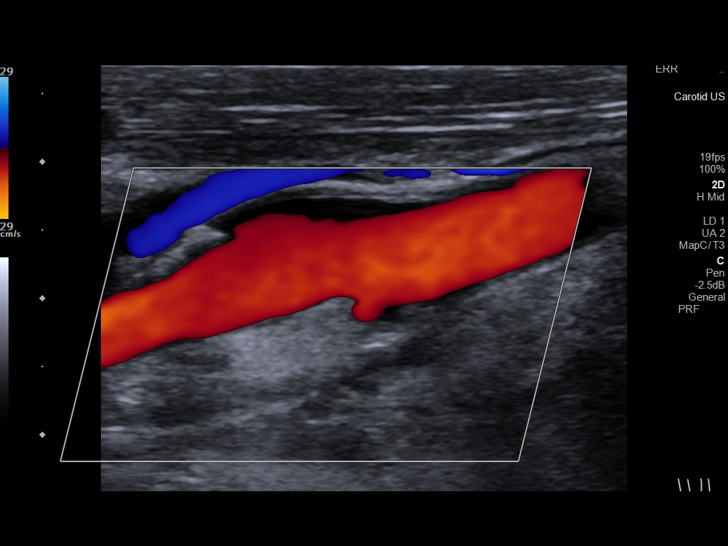
[im 56/68]
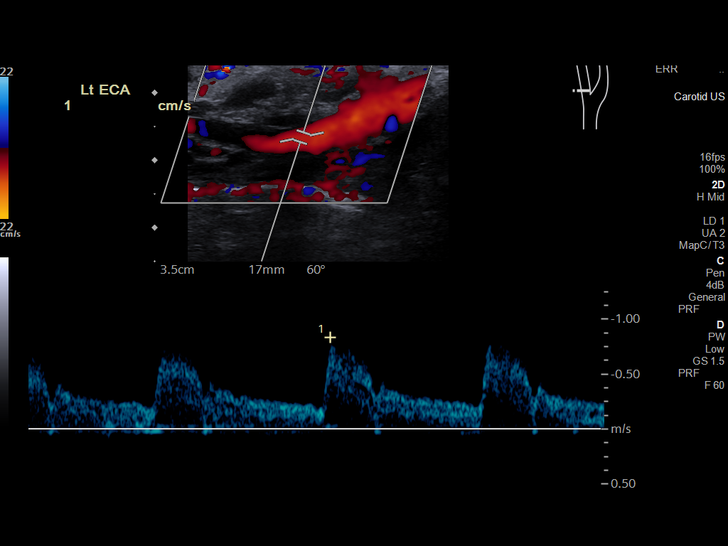
[im 62/68]
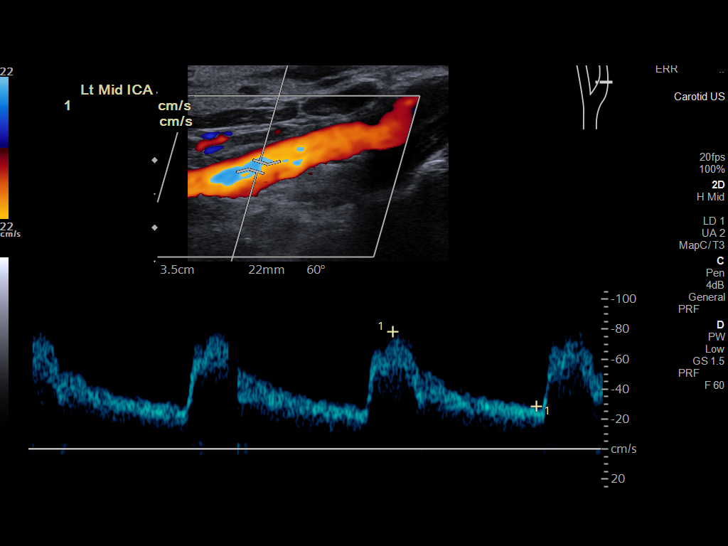
[im 68/68]
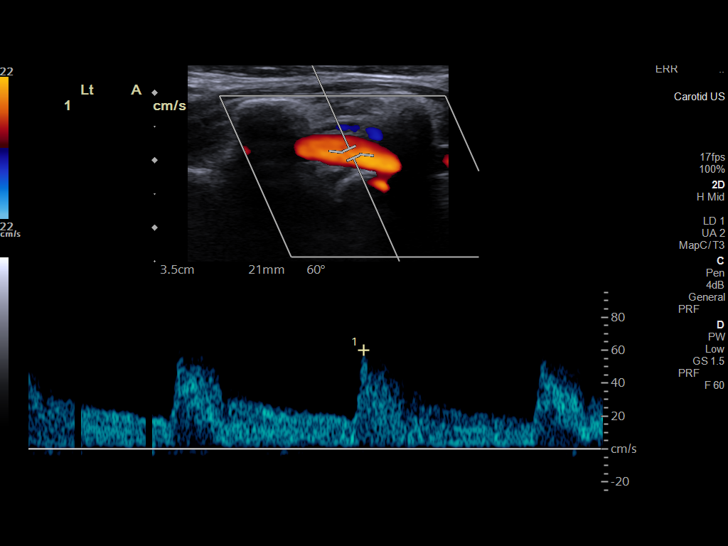

[13 of 24 positions shown; findings below may reference images not displayed]

FINDINGS: Criteria: Quantification of carotid stenosis is based on velocity
parameters that correlate the residual internal carotid diameter
with NASCET-based stenosis levels, using the diameter of the distal
internal carotid lumen as the denominator for stenosis measurement.

The following velocity measurements were obtained:

RIGHT

ICA:  Systolic 76 cm/sec, Diastolic 34 cm/sec

CCA:  70 cm/sec

SYSTOLIC ICA/CCA RATIO:

ECA:  91 cm/sec

LEFT

ICA:  Systolic 79 cm/sec, Diastolic 27 cm/sec

CCA:  75 cm/sec

SYSTOLIC ICA/CCA RATIO:

ECA:  83 cm/sec

Right Brachial SBP: Not acquired

Left Brachial SBP: Not acquired

RIGHT CAROTID ARTERY: No significant calcified disease of the right
common carotid artery. Intermediate waveform maintained.
Heterogeneous plaque without significant calcifications at the right
carotid bifurcation. Low resistance waveform of the right ICA. No
significant tortuosity.

RIGHT VERTEBRAL ARTERY: Antegrade flow with low resistance waveform.

LEFT CAROTID ARTERY: No significant calcified disease of the left
common carotid artery. Intermediate waveform maintained.
Heterogeneous plaque at the left carotid bifurcation without
significant calcifications. Low resistance waveform of the left ICA.

LEFT VERTEBRAL ARTERY:  Antegrade flow with low resistance waveform.
IMPRESSION: Color duplex indicates minimal heterogeneous plaque, with no
hemodynamically significant stenosis by duplex criteria in the
extracranial cerebrovascular circulation.

## 2023-06-01 ENCOUNTER — Telehealth: Payer: Self-pay | Admitting: Internal Medicine

## 2023-06-01 NOTE — Telephone Encounter (Signed)
Needs new patient approval

## 2023-06-01 NOTE — Telephone Encounter (Signed)
Patient informed.
